# Patient Record
Sex: Male | Born: 2000
Health system: Southern US, Community
[De-identification: ages and names within clinical notes are randomized; demographics above are authoritative.]

---
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
## 2022-11-03 IMAGING — US US ABDOMEN COMPLETE
1 series · 14 of 25 positions shown · non-contrast
Comparison: None.

CLINICAL DATA: Splenomegaly

EXAM:
ABDOMEN ULTRASOUND COMPLETE

[Series 1: us abdomen complete · 0.17mm/px · 14 of 87 slices shown]
[im 1/87]
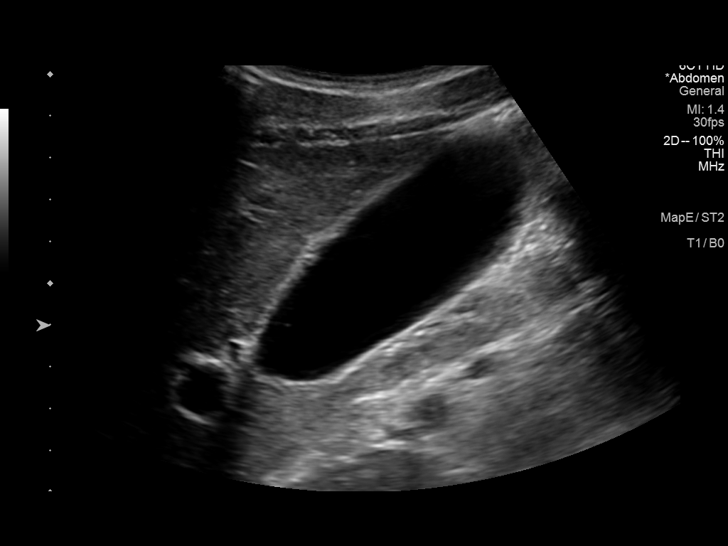
[im 8/87]
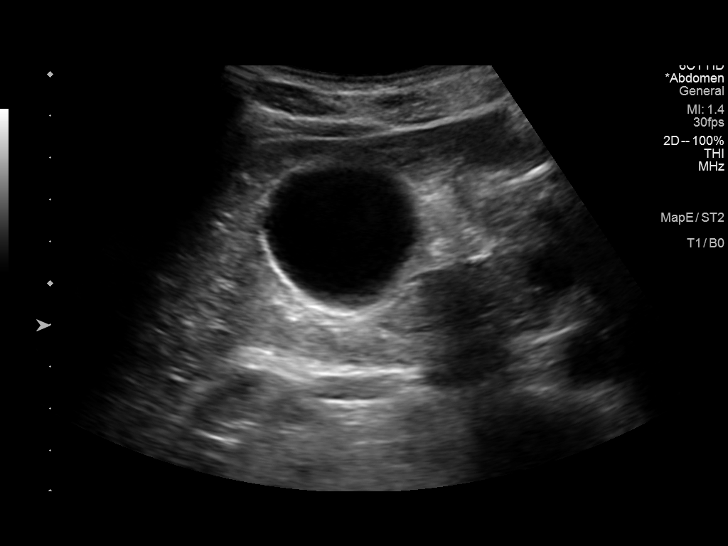
[im 15/87]
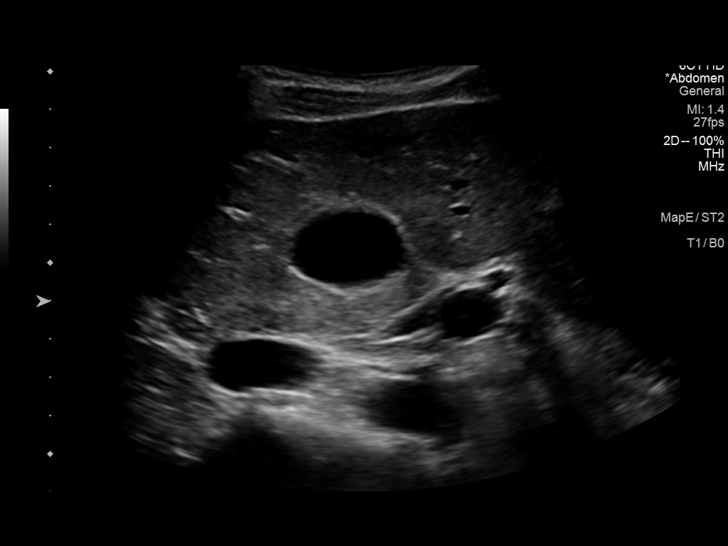
[im 22/87]
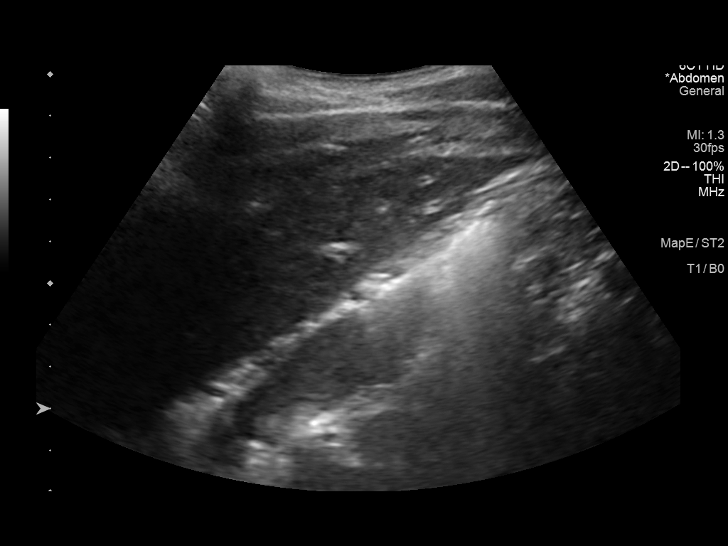
[im 29/87]
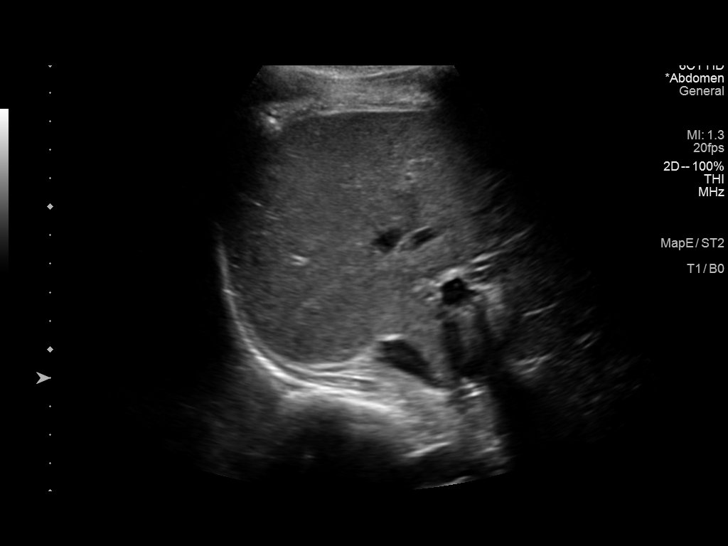
[im 33/87]
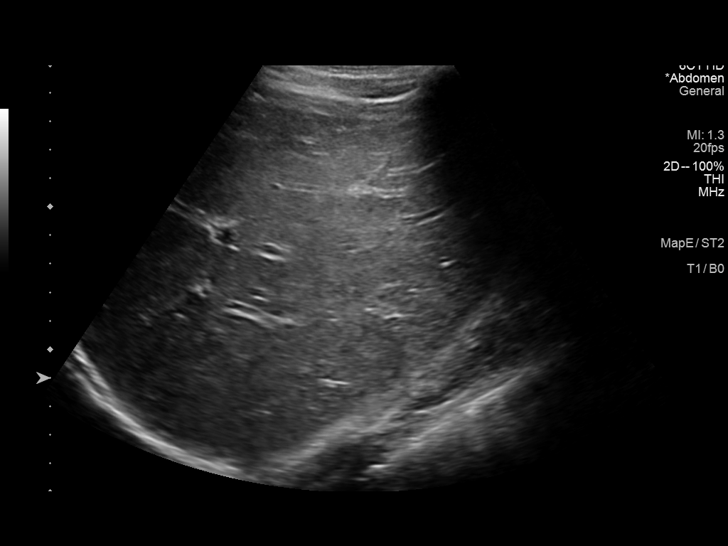
[im 40/87]
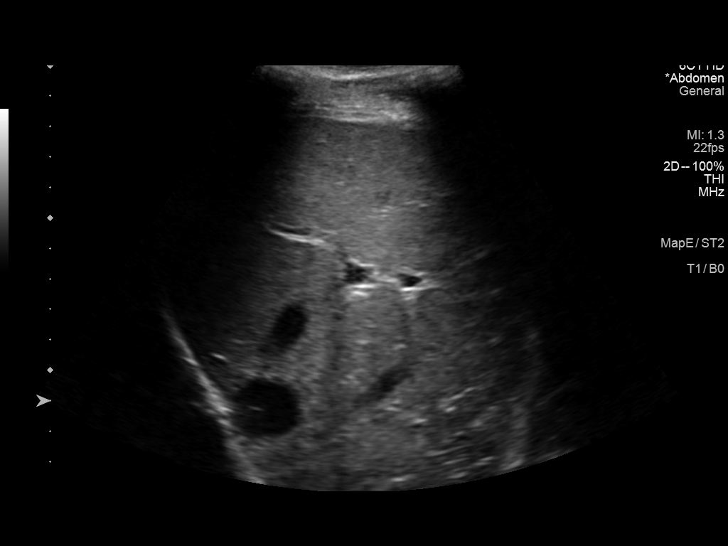
[im 47/87]
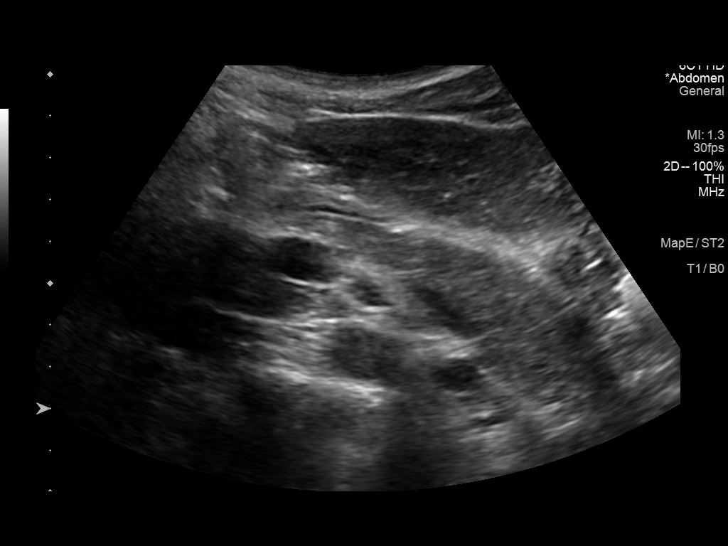
[im 54/87]
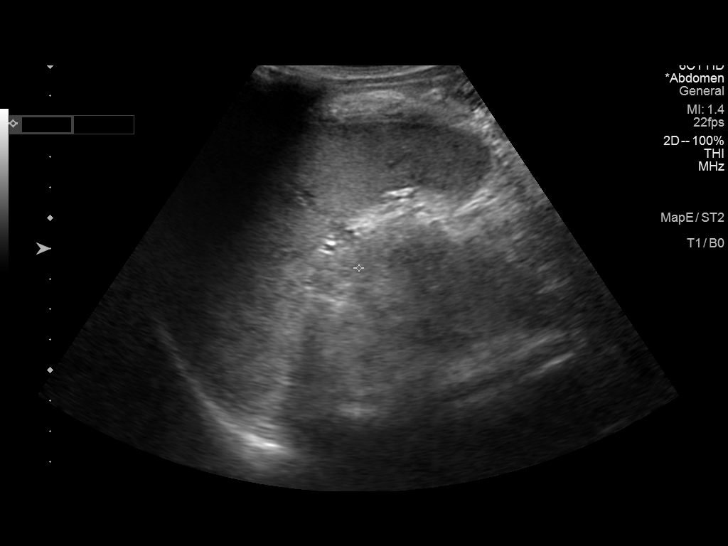
[im 58/87]
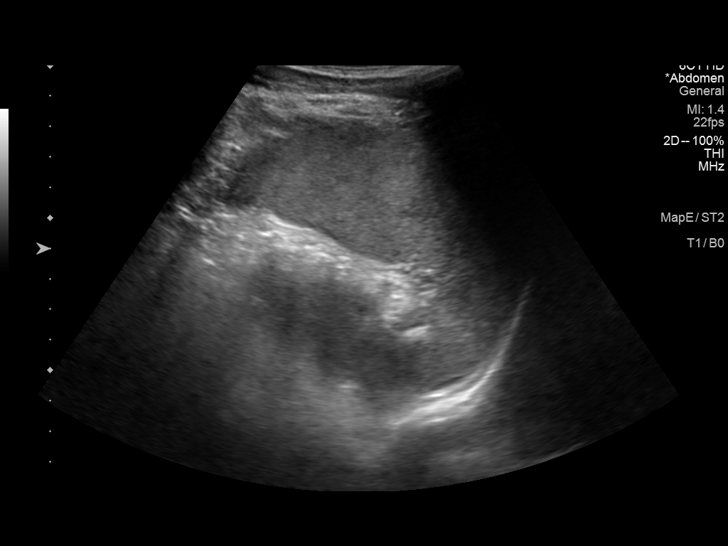
[im 65/87]
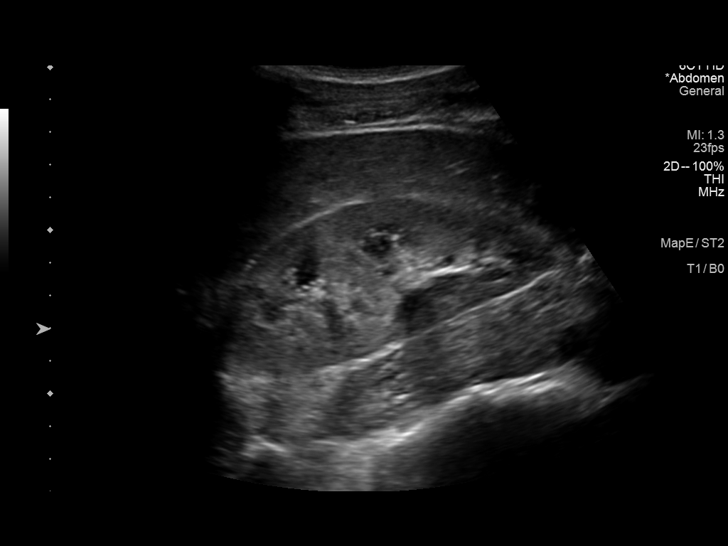
[im 72/87]
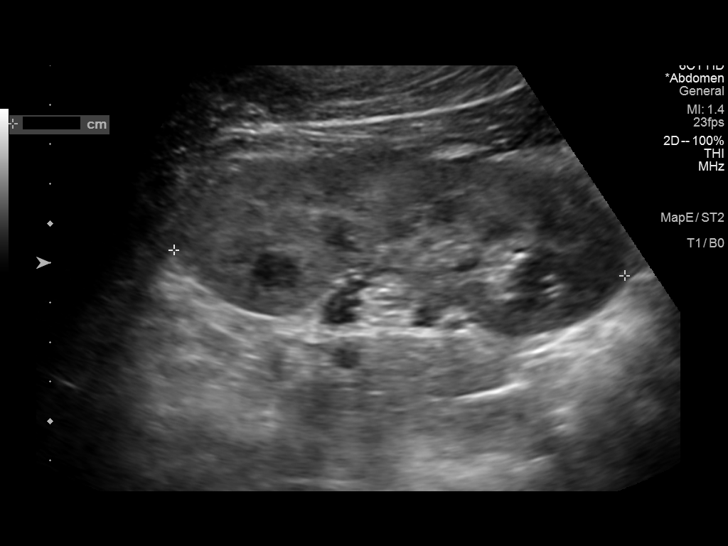
[im 79/87]
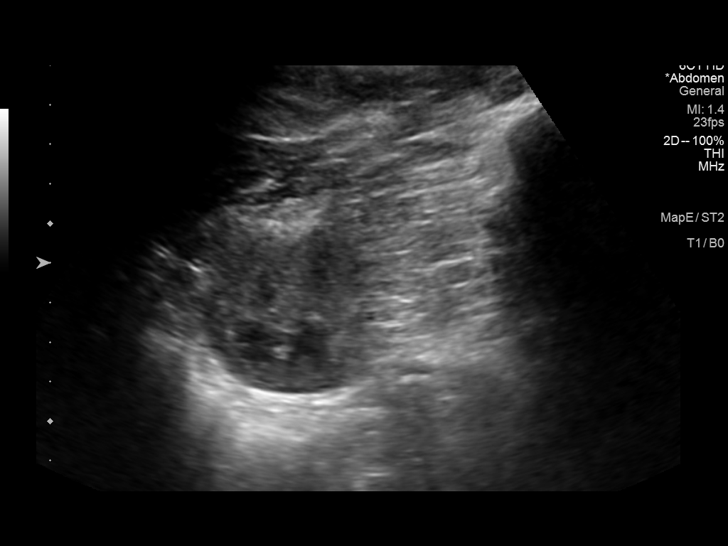
[im 87/87]
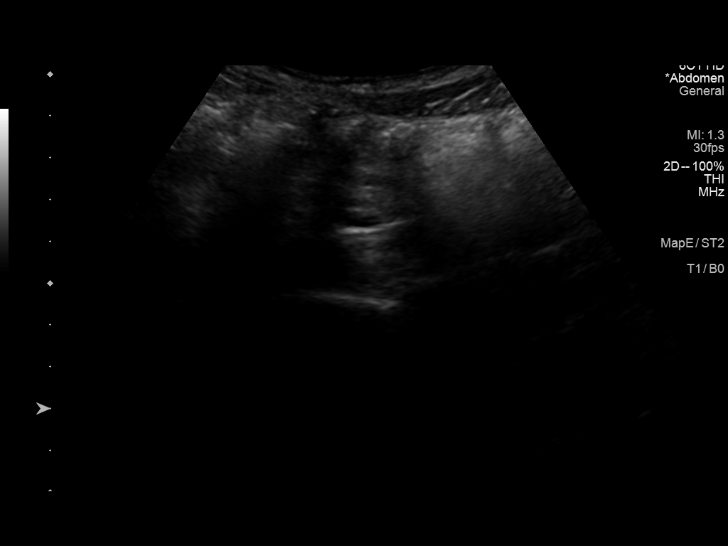

[14 of 25 positions shown; findings below may reference images not displayed]

FINDINGS: Gallbladder: No gallstones or wall thickening visualized. No
sonographic Murphy sign noted by sonographer.

Common bile duct: Diameter: 3.3 mm

Liver: No focal lesion identified. Within normal limits in
parenchymal echogenicity. Portal vein is patent on color Doppler
imaging with normal direction of blood flow towards the liver.

IVC: No abnormality visualized.

Pancreas: Visualized portion unremarkable.

Spleen: Splenic length 11.4 cm.  Splenic volume 294 mL

Right Kidney: Length: 12.4 cm. Echogenicity within normal limits. No
mass or hydronephrosis visualized.

Left Kidney: Length: 11.4 cm. Echogenicity within normal limits. No
mass or hydronephrosis visualized.

Abdominal aorta: No aneurysm visualized.

Other findings: None.
IMPRESSION: Mild splenomegaly

Otherwise negative
